# Patient Record
Sex: Male | Born: 1975 | Race: White | Hispanic: Yes | Marital: Single
Health system: Western US, Academic
[De-identification: ages and names within clinical notes are randomized; demographics above are authoritative.]

---

## 2018-12-21 ENCOUNTER — Telehealth: Payer: Self-pay

## 2018-12-21 NOTE — Telephone Encounter (Signed)
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

## 2018-12-21 NOTE — Telephone Encounter (Signed)
Triage Nurse Novel Coronavirus (2019-nCoV) Triage Nurse Action    Triage Nurse Phone Assessment:  Recent Travel Screening  Fever (99.0 F or higher) or chills: Yes, patient masked, isolation precaution initiated(102 t max)  New Rash : No  Cold or flu-like symptoms: Yes, patient masked, isolation precaution initiated  Symptoms: Fever >99.0;Diarrhea(stuffy nose)  Sick household or close contact with cold or flu symptoms w/in 14 days?: Yes, patient masked, isolation precaution Catering manager)  Proof of diagnosis with COVID-19 from a non-[facility] lab? : No  International travel w/in 30 days: No    [place] COVID-19 Testing Criteria      Disposition/Action Taken:    . Does the patient meet criteria for Testing?    (Yes/No) yes  Priority Level: 2- Engineer, structural  . Additional Instructions given:   Referred for COVID 19 testing. Provided with self-monitoring at home instructions.   Self-Monitoring at Home Instructions:  . Instruct patient to stay home for next 14 days. Do not go outside home.  . Self-monitor twice daily at home for 14 days, looking for:  o Fever (temperature higher than 100.4 degrees Fahrenheit).  o New respiratory symptoms such as cough, shortness of breath, runny nose.  . Separate yourself from others in your home as much as possible.  Marland Kitchen Keep a 6-foot distance from others in the home as much as possible.  . Wear a face mask when you are in the same room as others.  If  you are unable to wear a face mask, your housemates can choose to wear a face mask around you  . Stay in a different from others in your home.  . If available, use a separate bathroom.  . Do not share household items (e.g., utensils, drinking glasses, towels, bedding) and wash right away with soap/detergent after you use them.  Wendee Copp your hands or use alcohol hand rub often, including before and after touching your face or any other object someone else may need to touch (e.g. door handle, refrigerator).  . Cover your cough and sneeze with  a tissue, or sneeze into your sleeve.  . Use disinfectant wipes to clean things that you touch that others may touch.  . Call back if symptoms change or worsen, call 911 if emergency care is needed.

## 2018-12-23 ENCOUNTER — Ambulatory Visit: Payer: BC Managed Care – POS | Attending: Family

## 2018-12-23 VITALS — Temp 98.0°F

## 2018-12-23 DIAGNOSIS — U071 COVID-19: Secondary | ICD-10-CM | POA: Insufficient documentation

## 2018-12-23 DIAGNOSIS — Z03818 Encounter for observation for suspected exposure to other biological agents ruled out: Secondary | ICD-10-CM

## 2018-12-23 DIAGNOSIS — R6889 Other general symptoms and signs: Secondary | ICD-10-CM

## 2018-12-23 LAB — CORONAVIRUS DISEASE 2019 (COVID-19) SCOV
COVID-19 Comment: NOT DETECTED
COVID-19 Result: DETECTED — AB

## 2018-12-23 NOTE — Progress Notes (Signed)
[facility] DEPARTMENT OF FAMILY MEDICINE - [facility] Through Testing    DATE/TIME: December 23, 2018 09:47    CHIEF COMPLAINT: [facility] Testing    Patient Name: Stephen Rose        DOB: [birth year]           MRN: [id number]    HISTORY OF PRESENT ILLNESS: Stephen Rose is a 43 year old male who presents for COVID19 testing.  Verified by RN Triage for appropriateness of testing.      Works for [employer]) [employer] department. Reports onset of fever, chills, myalgia on Saturday 12/19/2018. No known exposures to COVID19+ individual, but works as a first Doctor, hospital.      Diagnosis:  Diagnoses and all orders for this visit:    Encntr for obs for susp expsr to oth biolg agents ruled out  -     Droplet PLUS Eye Protection Precautions  -     Contact Precautions  -     Coronavirus Disease 2019 (COVID-19) [place] Nasal-Pharyngeal    Flu-like symptoms  -     Droplet PLUS Eye Protection Precautions  -     Contact Precautions  -     Coronavirus Disease 2019 (COVID-19) [place] Nasal-Pharyngeal         Patient Verification:  Name and DOB    Visualization of Patient in Car: No Distress    Vitals: Temp 98 F (36.7 C) (Skin)   SpO2 97%      Pulse Oximetry: >89% COVID-19 Swab Performed   Swab performed and Specimen sent to Lab:  Yes    Patient education and temperature monitor provided to the patient. Counseling on cause, course, self-care, monitoring, social distancing, isolation and OTC medications provided.    Follow up: Instructed to follow up with police [place]. Instructed to follow up with PCP as needed.   [place] instructed to contact clinic by phone with new concerns if needed.  ER Precautions  [place] to be contacted with results in 2-4 days.      Arnell Asal, NP   Family Nurse Practitioner - [facility]

## 2018-12-24 ENCOUNTER — Telehealth: Payer: Self-pay | Admitting: Family

## 2018-12-24 NOTE — Telephone Encounter (Signed)
Phone call to patient regarding results: COVID 19 detected.  No answer, left VMM to return call to clinic. Will continue contact attempts at a later time.       PLAN: Route to RN pool to relay results & information to patient on his return call to clinic. 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 Please contact provider if patient has additional questions.     Instructions to Patient:  Bari Edward will notify [place]  -Schedule an appointment with your primary care provider (PCP) for follow-up (weekly recommended, until symptoms resolving). May also contact PCP/clinic by phone for worsening symptoms.   -Isolate from other members of family and wear mask as much as possible. Family members to wear mask when coming into your room to deliver food or provide care.  -No visitors and do not go out; have someone bring groceries and leave outside door.  -Strict handwashing, do not touch face/eyes/nose/mouth  -Call 911 if you feel short of breath at rest and unable to complete sentences without taking breath  -Monitor your temperature twice daily and log and notify PCP if over 100.4  -According to current CDC guidelines (May 2020): Continue home isolation until 10 days after symptom onset AND afebrile without antipyretics x 72 hours. If symptoms improved or resolved at that time, may release from home isolation.

## 2018-12-24 NOTE — Telephone Encounter (Signed)
Patient is returning call please return call. Thank you

## 2018-12-24 NOTE — Telephone Encounter (Signed)
Called and spoke with patient. Reviewed instructions provided by Donaciano Eva, NP. Patient verbalized understanding of instructions. No further questions or concerns.  RCornejo, RN

## 2019-01-04 ENCOUNTER — Telehealth: Payer: Self-pay

## 2019-01-04 NOTE — Telephone Encounter (Signed)
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        Please call to schedule

## 2019-01-04 NOTE — Telephone Encounter (Signed)
Called patient back notified [facility] does not offer retesting for covid 19 work clearance.
